# Patient Record
Sex: Female | Born: 1935 | Race: White | Hispanic: No | Smoking: Former smoker
Health system: Southern US, Community
[De-identification: ages and names within clinical notes are randomized; demographics above are authoritative.]

## PROBLEM LIST (undated history)

## (undated) DIAGNOSIS — K219 Gastro-esophageal reflux disease without esophagitis: Secondary | ICD-10-CM

## (undated) DIAGNOSIS — T8859XA Other complications of anesthesia, initial encounter: Secondary | ICD-10-CM

## (undated) DIAGNOSIS — T4145XA Adverse effect of unspecified anesthetic, initial encounter: Secondary | ICD-10-CM

## (undated) DIAGNOSIS — F419 Anxiety disorder, unspecified: Secondary | ICD-10-CM

## (undated) DIAGNOSIS — R112 Nausea with vomiting, unspecified: Secondary | ICD-10-CM

## (undated) DIAGNOSIS — M1990 Unspecified osteoarthritis, unspecified site: Secondary | ICD-10-CM

## (undated) DIAGNOSIS — J302 Other seasonal allergic rhinitis: Secondary | ICD-10-CM

## (undated) DIAGNOSIS — Z9889 Other specified postprocedural states: Secondary | ICD-10-CM

## (undated) DIAGNOSIS — IMO0001 Reserved for inherently not codable concepts without codable children: Secondary | ICD-10-CM

## (undated) HISTORY — PX: COLONOSCOPY: SHX174

## (undated) HISTORY — PX: TUBAL LIGATION: SHX77

## (undated) HISTORY — PX: UPPER GI ENDOSCOPY: SHX6162

---

## 2007-06-30 ENCOUNTER — Ambulatory Visit: Payer: Self-pay | Admitting: Family Medicine

## 2008-07-01 ENCOUNTER — Ambulatory Visit: Payer: Self-pay | Admitting: Family Medicine

## 2009-02-14 ENCOUNTER — Ambulatory Visit: Payer: Self-pay | Admitting: Family Medicine

## 2009-07-11 ENCOUNTER — Ambulatory Visit: Payer: Self-pay | Admitting: Family Medicine

## 2011-11-05 ENCOUNTER — Ambulatory Visit: Payer: Self-pay

## 2014-12-01 ENCOUNTER — Ambulatory Visit: Payer: Self-pay | Admitting: Internal Medicine

## 2015-03-29 NOTE — Discharge Instructions (Signed)
Cataract Surgery °Care After °Refer to this sheet in the next few weeks. These instructions provide you with information on caring for yourself after your procedure. Your caregiver may also give you more specific instructions. Your treatment has been planned according to current medical practices, but problems sometimes occur. Call your caregiver if you have any problems or questions after your procedure.  °HOME CARE INSTRUCTIONS  °· Avoid strenuous activities as directed by your caregiver. °· Ask your caregiver when you can resume driving. °· Use eyedrops or other medicines to help healing and control pressure inside your eye as directed by your caregiver. °· Only take over-the-counter or prescription medicines for pain, discomfort, or fever as directed by your caregiver. °· Do not to touch or rub your eyes. °· You may be instructed to use a protective shield during the first few days and nights after surgery. If not, wear sunglasses to protect your eyes. This is to protect the eye from pressure or from being accidentally bumped. °· Keep the area around your eye clean and dry. Avoid swimming or allowing water to hit you directly in the face while showering. Keep soap and shampoo out of your eyes. °· Do not bend or lift heavy objects. Bending increases pressure in the eye. You can walk, climb stairs, and do light household chores. °· Do not put a contact lens into the eye that had surgery until your caregiver says it is okay to do so. °· Ask your doctor when you can return to work. This will depend on the kind of work that you do. If you work in a dusty environment, you may be advised to wear protective eyewear for a period of time. °· Ask your caregiver when it will be safe to engage in sexual activity. °· Continue with your regular eye exams as directed by your caregiver. °What to expect: °· It is normal to feel itching and mild discomfort for a few days after cataract surgery. Some fluid discharge is also common,  and your eye may be sensitive to light and touch. °· After 1 to 2 days, even moderate discomfort should disappear. In most cases, healing will take about 6 weeks. °· If you received an intraocular lens (IOL), you may notice that colors are very bright or have a blue tinge. Also, if you have been in bright sunlight, everything may appear reddish for a few hours. If you see these color tinges, it is because your lens is clear and no longer cloudy. Within a few months after receiving an IOL, these extra colors should go away. When you have healed, you will probably need new glasses. °SEEK MEDICAL CARE IF:  °· You have increased bruising around your eye. °· You have discomfort not helped by medicine. °SEEK IMMEDIATE MEDICAL CARE IF:  °· You have a  fever. °· You have a worsening or sudden vision loss. °· You have redness, swelling, or increasing pain in the eye. °· You have a thick discharge from the eye that had surgery. °MAKE SURE YOU: °· Understand these instructions. °· Will watch your condition. °· Will get help right away if you are not doing well or get worse. °Document Released: 06/01/2005 Document Revised: 02/04/2012 Document Reviewed: 07/06/2011 °ExitCare® Patient Information ©2015 [employer]. This information is not intended to replace advice given to you by your health care provider. Make sure you discuss any questions you have with your health care provider. °General Anesthesia, Care After °Refer to this sheet in the next few weeks.   These instructions provide you with information on caring for yourself after your procedure. Your health care provider may also give you more specific instructions. Your treatment has been planned according to current medical practices, but problems sometimes occur. Call your health care provider if you have any problems or questions after your procedure. WHAT TO EXPECT AFTER THE PROCEDURE After the procedure, it is typical to experience:  Sleepiness.  Nausea and  vomiting. HOME CARE INSTRUCTIONS  For the first 24 hours after general anesthesia:  Have a responsible person with you.  Do not drive a car. If you are alone, do not take public transportation.  Do not drink alcohol.  Do not take medicine that has not been prescribed by your health care provider.  Do not sign important papers or make important decisions.  You may resume a normal diet and activities as directed by your health care provider.  Change bandages (dressings) as directed.  If you have questions or problems that seem related to general anesthesia, call the hospital and ask for the anesthetist or anesthesiologist on call. SEEK MEDICAL CARE IF:  You have nausea and vomiting that continue the day after anesthesia.  You develop a rash. SEEK IMMEDIATE MEDICAL CARE IF:   You have difficulty breathing.  You have chest pain.  You have any allergic problems.   FOLLOW UP APPOINTMENT IS: Tomorrow, May 5 @ 11:05 am  Document Released: 02/18/2001 Document Revised: 11/17/2013 Document Reviewed: 05/28/2013 [facility] Patient Information 2015 [place]ExitCare, [place]. This information is not intended to replace advice given to you by your health care provider. Make sure you discuss any questions you have with your health care provider.

## 2015-03-30 ENCOUNTER — Ambulatory Visit
Admission: RE | Admit: 2015-03-30 | Discharge: 2015-03-30 | Disposition: A | Discharge status: Home / Self Care | Payer: Medicare Other | Source: Ambulatory Visit | Attending: Ophthalmology | Admitting: Ophthalmology

## 2015-03-30 ENCOUNTER — Encounter
Admission: RE | Disposition: A | Discharge status: Home / Self Care | Payer: Self-pay | Source: Ambulatory Visit | Attending: Ophthalmology

## 2015-03-30 ENCOUNTER — Ambulatory Visit: Payer: Medicare Other | Admitting: Anesthesiology

## 2015-03-30 DIAGNOSIS — Z888 Allergy status to other drugs, medicaments and biological substances status: Secondary | ICD-10-CM | POA: Diagnosis not present

## 2015-03-30 DIAGNOSIS — H2512 Age-related nuclear cataract, left eye: Secondary | ICD-10-CM | POA: Diagnosis not present

## 2015-03-30 DIAGNOSIS — M1990 Unspecified osteoarthritis, unspecified site: Secondary | ICD-10-CM | POA: Insufficient documentation

## 2015-03-30 DIAGNOSIS — Z87891 Personal history of nicotine dependence: Secondary | ICD-10-CM | POA: Insufficient documentation

## 2015-03-30 DIAGNOSIS — K219 Gastro-esophageal reflux disease without esophagitis: Secondary | ICD-10-CM | POA: Insufficient documentation

## 2015-03-30 HISTORY — DX: Gastro-esophageal reflux disease without esophagitis: K21.9

## 2015-03-30 HISTORY — DX: Reserved for inherently not codable concepts without codable children: IMO0001

## 2015-03-30 HISTORY — DX: Other complications of anesthesia, initial encounter: T88.59XA

## 2015-03-30 HISTORY — DX: Other seasonal allergic rhinitis: J30.2

## 2015-03-30 HISTORY — DX: Anxiety disorder, unspecified: F41.9

## 2015-03-30 HISTORY — DX: Other specified postprocedural states: Z98.890

## 2015-03-30 HISTORY — PX: CATARACT EXTRACTION W/PHACO: SHX586

## 2015-03-30 HISTORY — DX: Nausea with vomiting, unspecified: R11.2

## 2015-03-30 HISTORY — DX: Adverse effect of unspecified anesthetic, initial encounter: T41.45XA

## 2015-03-30 HISTORY — DX: Unspecified osteoarthritis, unspecified site: M19.90

## 2015-03-30 SURGERY — PHACOEMULSIFICATION, CATARACT, WITH IOL INSERTION
Anesthesia: Monitor Anesthesia Care | Laterality: Left

## 2015-03-30 MED ORDER — FENTANYL CITRATE (PF) 100 MCG/2ML IJ SOLN
INTRAMUSCULAR | Status: DC | PRN
  Administered 2015-03-30: 50 ug via INTRAVENOUS

## 2015-03-30 MED ORDER — POVIDONE-IODINE 5 % OP SOLN
1.0000 "application " | Freq: Once | OPHTHALMIC | Status: AC
  Administered 2015-03-30: 1 via OPHTHALMIC

## 2015-03-30 MED ORDER — BSS IO SOLN
INTRAOCULAR | Status: DC | PRN
  Administered 2015-03-30: 3 mL via INTRAOCULAR
  Administered 2015-03-30: 1 via INTRAOCULAR

## 2015-03-30 MED ORDER — MIDAZOLAM HCL 2 MG/2ML IJ SOLN
INTRAMUSCULAR | Status: DC | PRN
  Administered 2015-03-30: 1 mg via INTRAVENOUS

## 2015-03-30 MED ORDER — TETRACAINE HCL 0.5 % OP SOLN
1.0000 [drp] | Freq: Once | OPHTHALMIC | Status: AC
  Administered 2015-03-30: 1 [drp] via OPHTHALMIC

## 2015-03-30 MED ORDER — EPINEPHRINE HCL 1 MG/ML IJ SOLN
INTRAMUSCULAR | Status: DC | PRN
  Administered 2015-03-30: 1 mg

## 2015-03-30 MED ORDER — BSS IO SOLN
INTRAOCULAR | Status: DC | PRN
  Administered 2015-03-30: 65 mL via INTRAOCULAR

## 2015-03-30 MED ORDER — ACETAMINOPHEN 160 MG/5ML PO SOLN
325.0000 mg | ORAL | Status: DC | PRN
Start: 2015-03-30 — End: 2015-03-30

## 2015-03-30 MED ORDER — LIDOCAINE HCL (PF) 4 % IJ SOLN
INTRAMUSCULAR | Status: DC | PRN
  Administered 2015-03-30: 3 mL

## 2015-03-30 MED ORDER — BRIMONIDINE TARTRATE 0.2 % OP SOLN
OPHTHALMIC | Status: DC | PRN
  Administered 2015-03-30: 1 [drp] via OPHTHALMIC

## 2015-03-30 MED ORDER — ACETAMINOPHEN 325 MG PO TABS
325.0000 mg | ORAL_TABLET | ORAL | Status: DC | PRN
Start: 2015-03-30 — End: 2015-03-30

## 2015-03-30 MED ORDER — ONDANSETRON HCL 4 MG/2ML IJ SOLN: 4.0000 mg | Freq: Once | INTRAMUSCULAR | Status: DC | PRN

## 2015-03-30 MED ORDER — CEFUROXIME OPHTHALMIC INJECTION 1 MG/0.1 ML
INJECTION | OPHTHALMIC | Status: DC | PRN
  Administered 2015-03-30: 3 mg via OPHTHALMIC

## 2015-03-30 MED ORDER — TIMOLOL MALEATE 0.5 % OP SOLN
OPHTHALMIC | Status: DC | PRN
  Administered 2015-03-30: 1 [drp] via OPHTHALMIC

## 2015-03-30 MED ORDER — NA HYALUR & NA CHOND-NA HYALUR 0.4-0.35 ML IO KIT
PACK | INTRAOCULAR | Status: DC | PRN
  Administered 2015-03-30: 1 mL via INTRAOCULAR

## 2015-03-30 MED ORDER — ARMC OPHTHALMIC DILATING GEL
1.0000 "application " | Freq: Once | OPHTHALMIC | Status: AC
  Administered 2015-03-30 (×2): 1 via OPHTHALMIC

## 2015-03-30 SURGICAL SUPPLY — 24 items
CANNULA ANT/CHMB 27GA (MISCELLANEOUS) ×3 IMPLANT
GLOVE SURG LX 7.5 STRW (GLOVE) ×2
GLOVE SURG LX STRL 7.5 STRW (GLOVE) ×1 IMPLANT
GLOVE SURG TRIUMPH 8.0 PF LTX (GLOVE) ×3 IMPLANT
GOWN STRL REUS W/ TWL LRG LVL3 (GOWN DISPOSABLE) ×2 IMPLANT
GOWN STRL REUS W/TWL LRG LVL3 (GOWN DISPOSABLE) ×4
LENS IOL TECNIS 21.5 (Intraocular Lens) ×3 IMPLANT
LENS IOL TECNIS MONO 1P 21.5 (Intraocular Lens) ×1 IMPLANT
MARKER SKIN SURG W/RULER VIO (MISCELLANEOUS) ×3 IMPLANT
NDL RETROBULBAR .5 NSTRL (NEEDLE) IMPLANT
NEEDLE FILTER BLUNT 18X 1/2SAF (NEEDLE) ×2
NEEDLE FILTER BLUNT 18X1 1/2 (NEEDLE) ×1 IMPLANT
PACK CATARACT BRASINGTON (MISCELLANEOUS) ×3 IMPLANT
PACK EYE AFTER SURG (MISCELLANEOUS) ×3 IMPLANT
PACK OPTHALMIC (MISCELLANEOUS) ×3 IMPLANT
RING MALYGIN 7.0 (MISCELLANEOUS) IMPLANT
SUT ETHILON 10-0 CS-B-6CS-B-6 (SUTURE)
SUT VICRYL  9 0 (SUTURE)
SUT VICRYL 9 0 (SUTURE) IMPLANT
SUTURE EHLN 10-0 CS-B-6CS-B-6 (SUTURE) IMPLANT
SYR 3ML LL SCALE MARK (SYRINGE) ×3 IMPLANT
SYR TB 1ML LUER SLIP (SYRINGE) ×3 IMPLANT
WATER STERILE IRR 500ML POUR (IV SOLUTION) ×3 IMPLANT
WIPE NON LINTING 3.25X3.25 (MISCELLANEOUS) ×3 IMPLANT

## 2015-03-30 NOTE — Anesthesia Postprocedure Evaluation (Signed)
  Anesthesia Post-op Note  Patient: Betty Munoz  Procedure(s) Performed: Procedure(s): CATARACT EXTRACTION PHACO AND INTRAOCULAR LENS PLACEMENT (IOC) (Left)  Anesthesia type:MAC  Patient location: PACU  Post pain: Pain level controlled  Post assessment: Post-op Vital signs reviewed, Patient's Cardiovascular Status Stable, Respiratory Function Stable, Patent Airway and No signs of Nausea or vomiting  Post vital signs: Reviewed and stable  Last Vitals:  Filed Vitals:   03/30/15 0739  BP:   Pulse: 77  Temp:   Resp:     Level of consciousness: awake, alert  and patient cooperative  Complications: No apparent anesthesia complications

## 2015-03-30 NOTE — Progress Notes (Signed)
Patient initially c/o dizziness upon arrival from OR.  Patient now reports "I feel fine."  Patient able to stand and transfer to wheelchair without difficulty.

## 2015-03-30 NOTE — H&P (Signed)
  The History and Physical notes were faxed and scanned in.  There were no changes in the patient's condition.  

## 2015-03-30 NOTE — Transfer of Care (Signed)
Immediate Anesthesia Transfer of Care Note  Patient: Betty Munoz  Procedure(s) Performed: Procedure(s): CATARACT EXTRACTION PHACO AND INTRAOCULAR LENS PLACEMENT (IOC) (Left)  Patient Location: PACU  Anesthesia Type: MAC  Level of Consciousness: awake, alert  and patient cooperative  Airway and Oxygen Therapy: Patient Spontanous Breathing and Patient connected to supplemental oxygen  Post-op Assessment: Post-op Vital signs reviewed, Patient's Cardiovascular Status Stable, Respiratory Function Stable, Patent Airway and No signs of Nausea or vomiting  Post-op Vital Signs: Reviewed and stable  Complications: No apparent anesthesia complications

## 2015-03-30 NOTE — Anesthesia Procedure Notes (Signed)
Procedure Name: MAC Performed by: Kya Mayfield Pre-anesthesia Checklist: Patient identified, Emergency Drugs available, Suction available, Patient being monitored and Timeout performed Patient Re-evaluated:Patient Re-evaluated prior to inductionOxygen Delivery Method: Nasal cannula       

## 2015-03-30 NOTE — Anesthesia Preprocedure Evaluation (Signed)
Anesthesia Evaluation  Patient identified by MRN, date of birth, ID band Patient awake    Reviewed: Allergy & Precautions, H&P , NPO status , Patient's Chart, lab work & pertinent test results  History of Anesthesia Complications (+) PONV and history of anesthetic complications  Airway Mallampati: II  TM Distance: >3 FB Neck ROM: full    Dental   Pulmonary former smoker,    Pulmonary exam normal       Cardiovascular Normal cardiovascular exam    Neuro/Psych PSYCHIATRIC DISORDERS (anxiety)    GI/Hepatic GERD-  ,  Endo/Other    Renal/GU      Musculoskeletal   Abdominal   Peds  Hematology   Anesthesia Other Findings   Reproductive/Obstetrics                             Anesthesia Physical Anesthesia Plan  ASA: II  Anesthesia Plan: MAC   Post-op Pain Management:    Induction:   Airway Management Planned:   Additional Equipment:   Intra-op Plan:   Post-operative Plan:   Informed Consent: I have reviewed the patients History and Physical, chart, labs and discussed the procedure including the risks, benefits and alternatives for the proposed anesthesia with the patient or authorized representative who has indicated his/her understanding and acceptance.     Plan Discussed with: CRNA  Anesthesia Plan Comments:         Anesthesia Quick Evaluation

## 2015-03-30 NOTE — Op Note (Signed)
OPERATIVE NOTE  Betty Munoz [id number] 03/30/2015   PREOPERATIVE DIAGNOSIS:     Nuclear sclerotic cataract left eye. H25.12   POSTOPERATIVE DIAGNOSIS:    Nuclear sclerotic cataract left eye.     PROCEDURE:  Phacoemusification with posterior chamber intraocular lens placement of the left eye   LENS:   Implant Name Type Inv. Item Serial No. Manufacturer Lot No. LRB No. Used  LENS IMPL INTRAOC ZCB00 21.5 - [id number] Intraocular Lens LENS IMPL INTRAOC ZCB00 21.5 [id number] AMO   Left 1       ULTRASOUND TIME: 15 of 1 minutes 25 seconds, CDE 13.0  SURGEON:  Deirdre Evenerhadwick R. Nasira Janusz, MD   ANESTHESIA:  Topical with tetracaine drops and 2% Xylocaine jelly, augmented with 1% preservative-free intracameral lidocaine.    COMPLICATIONS:  None.   DESCRIPTION OF PROCEDURE:  The patient was identified in the holding room and transported to the operating room and placed in the supine position under the operating microscope.  The left eye was identified as the operative eye and it was prepped and draped in the usual sterile ophthalmic fashion.   A 1 millimeter clear-corneal paracentesis was made at the 1:30 position. 0.5 ml of preservative-free 1% lidocaine was injected into the anterior chamber.  The anterior chamber was filled with Viscoat viscoelastic.  A 2.4 millimeter keratome was used to make a near-clear corneal incision at the 10:30 position.  .  A curvilinear capsulorrhexis was made with a cystotome and capsulorrhexis forceps.  Balanced salt solution was used to hydrodissect and hydrodelineate the nucleus.   Phacoemulsification was then used in stop and chop fashion to remove the lens nucleus and epinucleus.  The remaining cortex was then removed using the irrigation and aspiration handpiece. Provisc was then placed into the capsular bag to distend it for lens placement.  A 21.5 -diopter lens was then injected into the capsular bag.  The remaining viscoelastic was aspirated.   Wounds  were hydrated with balanced salt solution.  The anterior chamber was inflated to a physiologic pressure with balanced salt solution.  No wound leaks were noted. Cefuroxime 0.1 ml of a 10mg /ml solution was injected into the anterior chamber for a dose of 1 mg of intracameral antibiotic at the completion of the case.   Timolol and Brimonidine drops were applied to the eye.  The patient was taken to the recovery room in stable condition without complications of anesthesia or surgery.  [name] 03/30/2015, 8:48 AM

## 2015-04-06 ENCOUNTER — Encounter: Payer: Self-pay | Admitting: Ophthalmology

## 2015-04-14 ENCOUNTER — Encounter: Payer: Self-pay | Admitting: Ophthalmology

## 2015-05-23 ENCOUNTER — Encounter: Payer: Self-pay | Admitting: *Deleted

## 2015-05-30 ENCOUNTER — Ambulatory Visit
Admission: EM | Admit: 2015-05-30 | Discharge: 2015-05-30 | Disposition: A | Discharge status: Home / Self Care | Payer: Medicare Other | Attending: Internal Medicine | Admitting: Internal Medicine

## 2015-05-30 ENCOUNTER — Encounter: Payer: Self-pay | Admitting: *Deleted

## 2015-05-30 DIAGNOSIS — J209 Acute bronchitis, unspecified: Secondary | ICD-10-CM | POA: Diagnosis not present

## 2015-05-30 MED ORDER — ALBUTEROL SULFATE HFA 108 (90 BASE) MCG/ACT IN AERS: 1.0000 | INHALATION_SPRAY | RESPIRATORY_TRACT | Status: DC | PRN

## 2015-05-30 MED ORDER — AZITHROMYCIN 250 MG PO TABS: 250.0000 mg | ORAL_TABLET | Freq: Every day | ORAL | Status: DC

## 2015-05-30 MED ORDER — IPRATROPIUM-ALBUTEROL 0.5-2.5 (3) MG/3ML IN SOLN: 3.0000 mL | Freq: Once | RESPIRATORY_TRACT | Status: DC

## 2015-05-30 MED ORDER — IPRATROPIUM-ALBUTEROL 0.5-2.5 (3) MG/3ML IN SOLN
3.0000 mL | Freq: Four times a day (QID) | RESPIRATORY_TRACT | Status: DC
  Administered 2015-05-30: 3 mL via RESPIRATORY_TRACT

## 2015-05-30 MED ORDER — AZITHROMYCIN 200 MG/5ML PO SUSR: 500.0000 mg | Freq: Every day | ORAL | Status: DC

## 2015-05-30 NOTE — Discharge Instructions (Signed)
Prescriptions for albuterol inhaler and zithromax were sent to [facility] in [place]. Prednisone may also be helpful in resolving wheezing, breathlessness quickly.  Chronic Obstructive Pulmonary Disease Chronic obstructive pulmonary disease (COPD) is a common lung problem. In COPD, the flow of air from the lungs is limited. The way your lungs work will probably never return to normal, but there are things you can do to improve your lungs and make yourself feel better. HOME CARE  Take all medicines as told by your doctor.  Avoid medicines or cough syrups that dry up your airway (such as antihistamines) and do not allow you to get rid of thick spit. You do not need to avoid them if told differently by your doctor.  If you smoke, stop. Smoking makes the problem worse.  Avoid being around things that make your breathing worse (like smoke, chemicals, and fumes).  Use oxygen therapy and therapy to help improve your lungs (pulmonary rehabilitation) if told by your doctor. If you need home oxygen therapy, ask your doctor if you should buy a tool to measure your oxygen level (oximeter).  Avoid people who have a sickness you can catch (contagious).  Avoid going outside when it is very hot, cold, or humid.  Eat healthy foods. Eat smaller meals more often. Rest before meals.  Stay active, but remember to also rest.  Make sure to get all the shots (vaccines) your doctor recommends. Ask your doctor if you need a pneumonia shot.  Learn and use tips on how to relax.  Learn and use tips on how to control your breathing as told by your doctor. Try:  Breathing in (inhaling) through your nose for 1 second. Then, pucker your lips and breath out (exhale) through your lips for 2 seconds.  Putting one hand on your belly (abdomen). Breathe in slowly through your nose for 1 second. Your hand on your belly should move out. Pucker your lips and breathe out slowly through your lips. Your hand on your belly should  move in as you breathe out.  Learn and use controlled coughing to clear thick spit from your lungs. The steps are: 1. Lean your head a little forward. 2. Breathe in deeply. 3. Try to hold your breath for 3 seconds. 4. Keep your mouth slightly open while coughing 2 times. 5. Spit any thick spit out into a tissue. 6. Rest and do the steps again 1 or 2 times as needed. GET HELP IF:  You cough up more thick spit than usual.  There is a change in the color or thickness of the spit.  It is harder to breathe than usual.  Your breathing is faster than usual. GET HELP RIGHT AWAY IF:   You have shortness of breath while resting.  You have shortness of breath that stops you from:  Being able to talk.  Doing normal activities.  You chest hurts for longer than 5 minutes.  Your skin color is more blue than usual.  Your pulse oximeter shows that you have low oxygen for longer than 5 minutes. MAKE SURE YOU:   Understand these instructions.  Will watch your condition.  Will get help right away if you are not doing well or get worse. Document Released: 04/30/2008 Document Revised: 03/29/2014 Document Reviewed: 07/09/2013 [facility] Patient Information 2015 [place]ExitCare, MarylandLLC. This information is not intended to replace advice given to you by your health care provider. Make sure you discuss any questions you have with your health care provider.

## 2015-05-30 NOTE — ED Notes (Signed)
Pt states "ive been wheezing, short of breath, coughing starting about 2 days ago"

## 2015-05-30 NOTE — ED Provider Notes (Addendum)
CSN: [id number]     Arrival date & time 05/30/15  1041 History   First MD Initiated Contact with Patient 05/30/15 1143     Chief Complaint  Patient presents with  . Cough  . Wheezing  . Fatigue  . Shortness of Breath   HPI Patient is an 81 year old lady with past medical history notable for bronchitis, former smoker. He presents with a couple days history of dyspnea, wheezing, cough. No fever. Pleuritic chest discomfort.  Past Medical History  Diagnosis Date  . Complication of anesthesia   . PONV (postoperative nausea and vomiting)     once  . Reflux   . Anxiety   . Arthritis     patient states all over, neck arms,hands,etc.  . Seasonal allergies    Past Surgical History  Procedure Laterality Date  . Colonoscopy    . Upper gi endoscopy    . Tubal ligation    . Cataract extraction w/phaco Left 03/30/2015    Procedure: CATARACT EXTRACTION PHACO AND INTRAOCULAR LENS PLACEMENT (IOC);  Surgeon: Lockie Molahadwick Brasington, MD;  [facility];  Service: Ophthalmology;  Laterality: Left;   No family history on file. History  Substance Use Topics  . Smoking status: Former Smoker -- 1.00 packs/day    Types: Cigarettes  . Smokeless tobacco: Not on file  . Alcohol Use: No   OB History    No data available     Review of Systems  All other systems reviewed and are negative.   Allergies  Adhesive; Bacitracin; and Neosporin  Home Medications   Prior to Admission medications   Medication Sig Start Date End Date Taking? Authorizing Provider  omeprazole (PRILOSEC) 40 MG capsule Take 40 mg by mouth daily. am   Yes Historical Provider, MD   BP 179/77 mmHg  Pulse 84  Ht 5' (1.524 m)  Wt 157 lb (71.215 kg)  BMI 30.66 kg/m2  SpO2 97% Physical Exam  Constitutional: She is oriented to person, place, and time. No distress.  Alert, nicely groomed  HENT:  Head: Atraumatic.  Eyes:  Conjugate gaze, no eye redness/drainage  Neck: Neck supple.  Cardiovascular: Normal rate  and regular rhythm.   Pulmonary/Chest: No respiratory distress. She has wheezes. She has no rales.  Soft wheezing throughout, symmetric breath sounds, somewhat diminished posteriorly.  Abdominal: She exhibits no distension.  Musculoskeletal: Normal range of motion.  No leg swelling  Neurological: She is alert and oriented to person, place, and time.  Skin: Skin is warm and dry.  No cyanosis  Nursing note and vitals reviewed.   ED Course  Procedures   ED ECG REPORT   Date: 05/30/2015  EKG Time: 10:35:27am  Rate: 82  Rhythm: normal sinus rhythm, with PACs  Axis: -15  Intervals:none  ST&T Change: none  Narrative Interpretation: sinus rhythm with premature atrial complexes, otherwise normal ECG I viewed this ECG and agree with above.  DuoNeb breathing treatment given at the urgent care for wheezing, with improvement in wheezing and dyspnea.  MDM   1. Bronchitis, acute, with bronchospasm   2.     Probable COPD Prescriptions for Zithromax and albuterol sent to the pharmacy. Discussed a prednisone burst with the patient, who would like to hold off for now. Reche[facility] symptoms    Eustace MooreLaura W Teshawn Moan, MD 05/30/15 1249  Eustace MooreLaura W Zellie Jenning, MD 05/30/15 [phone]

## 2015-05-31 ENCOUNTER — Encounter: Payer: Self-pay | Admitting: Anesthesiology

## 2015-05-31 NOTE — Discharge Instructions (Signed)

## 2015-06-01 ENCOUNTER — Ambulatory Visit: Admission: RE | Admit: 2015-06-01 | Payer: Medicare Other | Source: Ambulatory Visit | Admitting: Ophthalmology

## 2015-06-01 SURGERY — PHACOEMULSIFICATION, CATARACT, WITH IOL INSERTION
Anesthesia: Topical | Laterality: Right

## 2015-06-15 ENCOUNTER — Encounter: Payer: Self-pay | Admitting: *Deleted

## 2015-06-17 NOTE — Discharge Instructions (Signed)

## 2015-06-22 ENCOUNTER — Encounter: Payer: Self-pay | Admitting: Anesthesiology

## 2015-06-22 ENCOUNTER — Ambulatory Visit: Payer: Medicare Other | Admitting: Anesthesiology

## 2015-06-22 ENCOUNTER — Encounter
Admission: RE | Disposition: A | Discharge status: Home / Self Care | Payer: Self-pay | Source: Ambulatory Visit | Attending: Ophthalmology

## 2015-06-22 ENCOUNTER — Ambulatory Visit
Admission: RE | Admit: 2015-06-22 | Discharge: 2015-06-22 | Disposition: A | Discharge status: Home / Self Care | Payer: Medicare Other | Source: Ambulatory Visit | Attending: Ophthalmology | Admitting: Ophthalmology

## 2015-06-22 DIAGNOSIS — H2511 Age-related nuclear cataract, right eye: Secondary | ICD-10-CM | POA: Insufficient documentation

## 2015-06-22 DIAGNOSIS — M1990 Unspecified osteoarthritis, unspecified site: Secondary | ICD-10-CM | POA: Insufficient documentation

## 2015-06-22 DIAGNOSIS — Z87891 Personal history of nicotine dependence: Secondary | ICD-10-CM | POA: Insufficient documentation

## 2015-06-22 DIAGNOSIS — Z881 Allergy status to other antibiotic agents status: Secondary | ICD-10-CM | POA: Diagnosis not present

## 2015-06-22 DIAGNOSIS — Z91048 Other nonmedicinal substance allergy status: Secondary | ICD-10-CM | POA: Insufficient documentation

## 2015-06-22 DIAGNOSIS — Z79899 Other long term (current) drug therapy: Secondary | ICD-10-CM | POA: Diagnosis not present

## 2015-06-22 DIAGNOSIS — Z9841 Cataract extraction status, right eye: Secondary | ICD-10-CM | POA: Insufficient documentation

## 2015-06-22 DIAGNOSIS — K219 Gastro-esophageal reflux disease without esophagitis: Secondary | ICD-10-CM | POA: Insufficient documentation

## 2015-06-22 HISTORY — PX: CATARACT EXTRACTION W/PHACO: SHX586

## 2015-06-22 SURGERY — PHACOEMULSIFICATION, CATARACT, WITH IOL INSERTION
Anesthesia: Monitor Anesthesia Care | Laterality: Right | Wound class: Clean

## 2015-06-22 MED ORDER — POVIDONE-IODINE 5 % OP SOLN
1.0000 "application " | OPHTHALMIC | Status: DC | PRN
  Administered 2015-06-22: 1 via OPHTHALMIC

## 2015-06-22 MED ORDER — MIDAZOLAM HCL 2 MG/2ML IJ SOLN
INTRAMUSCULAR | Status: DC | PRN
  Administered 2015-06-22: 2 mg via INTRAVENOUS

## 2015-06-22 MED ORDER — TIMOLOL MALEATE 0.5 % OP SOLN
OPHTHALMIC | Status: DC | PRN
Start: 2015-06-22 — End: 2015-06-22
  Administered 2015-06-22: 1 [drp] via OPHTHALMIC

## 2015-06-22 MED ORDER — FENTANYL CITRATE (PF) 100 MCG/2ML IJ SOLN
INTRAMUSCULAR | Status: DC | PRN
  Administered 2015-06-22: 50 ug via INTRAVENOUS

## 2015-06-22 MED ORDER — CEFUROXIME OPHTHALMIC INJECTION 1 MG/0.1 ML
INJECTION | OPHTHALMIC | Status: DC | PRN
Start: 2015-06-22 — End: 2015-06-22
  Administered 2015-06-22: 0.1 mL via INTRACAMERAL

## 2015-06-22 MED ORDER — ARMC OPHTHALMIC DILATING GEL
1.0000 "application " | OPHTHALMIC | Status: DC | PRN
  Administered 2015-06-22 (×2): 1 via OPHTHALMIC

## 2015-06-22 MED ORDER — BRIMONIDINE TARTRATE 0.2 % OP SOLN
OPHTHALMIC | Status: DC | PRN
Start: 2015-06-22 — End: 2015-06-22
  Administered 2015-06-22: 1 [drp] via OPHTHALMIC

## 2015-06-22 MED ORDER — NA HYALUR & NA CHOND-NA HYALUR 0.4-0.35 ML IO KIT
PACK | INTRAOCULAR | Status: DC | PRN
Start: 2015-06-22 — End: 2015-06-22
  Administered 2015-06-22: 1 mL via INTRAOCULAR

## 2015-06-22 MED ORDER — TETRACAINE HCL 0.5 % OP SOLN
1.0000 [drp] | OPHTHALMIC | Status: DC | PRN
  Administered 2015-06-22: 1 [drp] via OPHTHALMIC

## 2015-06-22 MED ORDER — LIDOCAINE HCL (PF) 4 % IJ SOLN
INTRAOCULAR | Status: DC | PRN
  Administered 2015-06-22: 1 mL via OPHTHALMIC

## 2015-06-22 MED ORDER — EPINEPHRINE HCL 1 MG/ML IJ SOLN
INTRAOCULAR | Status: DC | PRN
  Administered 2015-06-22: 60 mL via OPHTHALMIC

## 2015-06-22 MED ORDER — LACTATED RINGERS IV SOLN: INTRAVENOUS | Status: DC

## 2015-06-22 SURGICAL SUPPLY — 26 items
CANNULA ANT/CHMB 27GA (MISCELLANEOUS) ×3 IMPLANT
GLOVE SURG LX 7.5 STRW (GLOVE) ×2
GLOVE SURG LX STRL 7.5 STRW (GLOVE) ×1 IMPLANT
GLOVE SURG TRIUMPH 8.0 PF LTX (GLOVE) ×3 IMPLANT
GOWN STRL REUS W/ TWL LRG LVL3 (GOWN DISPOSABLE) ×2 IMPLANT
GOWN STRL REUS W/TWL LRG LVL3 (GOWN DISPOSABLE) ×4
LENS IOL TECNIS 21.5 (Intraocular Lens) ×3 IMPLANT
LENS IOL TECNIS MONO 1P 21.5 (Intraocular Lens) ×1 IMPLANT
MARKER SKIN SURG W/RULER VIO (MISCELLANEOUS) ×3 IMPLANT
NDL RETROBULBAR .5 NSTRL (NEEDLE) IMPLANT
NEEDLE FILTER BLUNT 18X 1/2SAF (NEEDLE) ×2
NEEDLE FILTER BLUNT 18X1 1/2 (NEEDLE) ×1 IMPLANT
PACK CATARACT BRASINGTON (MISCELLANEOUS) ×3 IMPLANT
PACK EYE AFTER SURG (MISCELLANEOUS) ×3 IMPLANT
PACK OPTHALMIC (MISCELLANEOUS) ×3 IMPLANT
RING MALYGIN 7.0 (MISCELLANEOUS) IMPLANT
SUT ETHILON 10-0 CS-B-6CS-B-6 (SUTURE)
SUT VICRYL  9 0 (SUTURE)
SUT VICRYL 9 0 (SUTURE) IMPLANT
SUTURE EHLN 10-0 CS-B-6CS-B-6 (SUTURE) IMPLANT
SYR 3ML LL SCALE MARK (SYRINGE) ×3 IMPLANT
SYR 5ML LL (SYRINGE) IMPLANT
SYR TB 1ML LUER SLIP (SYRINGE) ×3 IMPLANT
WATER STERILE IRR 250ML POUR (IV SOLUTION) ×3 IMPLANT
WATER STERILE IRR 500ML POUR (IV SOLUTION) IMPLANT
WIPE NON LINTING 3.25X3.25 (MISCELLANEOUS) ×3 IMPLANT

## 2015-06-22 NOTE — Anesthesia Preprocedure Evaluation (Signed)
Anesthesia Evaluation  Patient identified by MRN, date of birth, ID band Patient awake    Reviewed: Allergy & Precautions, H&P , NPO status , Patient's Chart, lab work & pertinent test results, reviewed documented beta blocker date and time   History of Anesthesia Complications (+) PONV and history of anesthetic complications  Airway Mallampati: II  TM Distance: >3 FB Neck ROM: full    Dental no notable dental hx.    Pulmonary neg pulmonary ROS, former smoker,  breath sounds clear to auscultation  Pulmonary exam normal       Cardiovascular Exercise Tolerance: Good negative cardio ROS  Rhythm:regular Rate:Normal     Neuro/Psych PSYCHIATRIC DISORDERS (anxiety) negative neurological ROS  negative psych ROS   GI/Hepatic Neg liver ROS, GERD-  ,  Endo/Other  negative endocrine ROS  Renal/GU negative Renal ROS  negative genitourinary   Musculoskeletal   Abdominal   Peds  Hematology negative hematology ROS (+)   Anesthesia Other Findings   Reproductive/Obstetrics negative OB ROS                             Anesthesia Physical Anesthesia Plan  ASA: II  Anesthesia Plan: MAC   Post-op Pain Management:    Induction:   Airway Management Planned:   Additional Equipment:   Intra-op Plan:   Post-operative Plan:   Informed Consent: I have reviewed the patients History and Physical, chart, labs and discussed the procedure including the risks, benefits and alternatives for the proposed anesthesia with the patient or authorized representative who has indicated his/her understanding and acceptance.   Dental Advisory Given  Plan Discussed with: CRNA  Anesthesia Plan Comments:         Anesthesia Quick Evaluation

## 2015-06-22 NOTE — Op Note (Signed)
LOCATION:  [facility]   PREOPERATIVE DIAGNOSIS:    Nuclear sclerotic cataract right eye. H25.11   POSTOPERATIVE DIAGNOSIS:  Nuclear sclerotic cataract right eye.     PROCEDURE:  Phacoemusification with posterior chamber intraocular lens placement of the right eye   LENS:   Implant Name Type Inv. Item Serial No. Manufacturer Lot No. LRB No. Used  LENS IMPL INTRAOC ZCB00 21.5 - [id number] Intraocular Lens LENS IMPL INTRAOC ZCB00 21.5 [id number] AMO   Right 1        ULTRASOUND TIME: 15 % of 1 minutes, 31 seconds.  CDE 13.8   SURGEON:  Deirdre Evener, MD   ANESTHESIA:  Topical with tetracaine drops and 2% Xylocaine jelly, augmented with 1% preservative-free intracameral lidocaine. .   COMPLICATIONS:  None.   DESCRIPTION OF PROCEDURE:  The patient was identified in the holding room and transported to the operating room and placed in the supine position under the operating microscope.  The right eye was identified as the operative eye and it was prepped and draped in the usual sterile ophthalmic fashion.   A 1 millimeter clear-corneal paracentesis was made at the 12:00 position.  0.5 ml of preservative-free 1% lidocaine was injected into the anterior chamber. The anterior chamber was filled with Viscoat viscoelastic.  A 2.4 millimeter keratome was used to make a near-clear corneal incision at the 9:00 position.  A curvilinear capsulorrhexis was made with a cystotome and capsulorrhexis forceps.  Balanced salt solution was used to hydrodissect and hydrodelineate the nucleus.   Phacoemulsification was then used in stop and chop fashion to remove the lens nucleus and epinucleus.  The remaining cortex was then removed using the irrigation and aspiration handpiece. Provisc was then placed into the capsular bag to distend it for lens placement.  A lens was then injected into the capsular bag.  The remaining viscoelastic was aspirated.   Wounds were hydrated with balanced salt  solution.  The anterior chamber was inflated to a physiologic pressure with balanced salt solution.  No wound leaks were noted. Cefuroxime 0.1 ml of a /ml solution was injected into the anterior chamber for a dose of 1 mg of intracameral antibiotic at the completion of the case.   Timolol and Brimonidine drops were applied to the eye.  The patient was taken to the recovery room in stable condition without complications of anesthesia or surgery.   Claira Jeter 06/22/2015, 11:28 AM

## 2015-06-22 NOTE — Anesthesia Postprocedure Evaluation (Signed)
  Anesthesia Post-op Note  Patient: Betty Munoz  Procedure(s) Performed: Procedure(s) with comments: CATARACT EXTRACTION PHACO AND INTRAOCULAR LENS PLACEMENT (IOC) (Right) - SHUGARCAINE  Anesthesia type:MAC  Patient location: PACU  Post pain: Pain level controlled  Post assessment: Post-op Vital signs reviewed, Patient's Cardiovascular Status Stable, Respiratory Function Stable, Patent Airway and No signs of Nausea or vomiting  Post vital signs: Reviewed and stable  Last Vitals:  Filed Vitals:   06/22/15 1130  BP:   Pulse: 65  Temp: 36.5 C  Resp:     Level of consciousness: awake, alert  and patient cooperative  Complications: No apparent anesthesia complications

## 2015-06-22 NOTE — Anesthesia Procedure Notes (Signed)
Procedure Name: MAC Performed by: Klohe Lovering Pre-anesthesia Checklist: Patient identified, Emergency Drugs available, Suction available, Timeout performed and Patient being monitored Patient Re-evaluated:Patient Re-evaluated prior to inductionOxygen Delivery Method: Nasal cannula Placement Confirmation: positive ETCO2     

## 2015-06-22 NOTE — Transfer of Care (Signed)
Immediate Anesthesia Transfer of Care Note  Patient: Betty Munoz  Procedure(s) Performed: Procedure(s) with comments: CATARACT EXTRACTION PHACO AND INTRAOCULAR LENS PLACEMENT (IOC) (Right) - SHUGARCAINE  Patient Location: PACU  Anesthesia Type: MAC  Level of Consciousness: awake, alert  and patient cooperative  Airway and Oxygen Therapy: Patient Spontanous Breathing and Patient connected to supplemental oxygen  Post-op Assessment: Post-op Vital signs reviewed, Patient's Cardiovascular Status Stable, Respiratory Function Stable, Patent Airway and No signs of Nausea or vomiting  Post-op Vital Signs: Reviewed and stable  Complications: No apparent anesthesia complications

## 2015-06-22 NOTE — H&P (Signed)
  The History and Physical notes were scanned in.  The patient remains stable and unchanged from the H&P.   Previous H&P reviewed, patient examined, and there are no changes.  Kerianne Gurr 06/22/2015 11:01 AM

## 2015-06-23 ENCOUNTER — Encounter: Payer: Self-pay | Admitting: Ophthalmology

## 2015-07-18 ENCOUNTER — Encounter: Payer: Self-pay | Admitting: Ophthalmology

## 2015-07-19 ENCOUNTER — Encounter: Payer: Self-pay | Admitting: Ophthalmology

## 2016-03-16 ENCOUNTER — Encounter: Payer: Self-pay | Admitting: Emergency Medicine

## 2016-03-16 ENCOUNTER — Ambulatory Visit
Admission: EM | Admit: 2016-03-16 | Discharge: 2016-03-16 | Disposition: A | Discharge status: Home / Self Care | Payer: Medicare Other | Attending: Family Medicine | Admitting: Family Medicine

## 2016-03-16 DIAGNOSIS — J069 Acute upper respiratory infection, unspecified: Secondary | ICD-10-CM | POA: Diagnosis not present

## 2016-03-16 DIAGNOSIS — J302 Other seasonal allergic rhinitis: Secondary | ICD-10-CM | POA: Diagnosis not present

## 2016-03-16 MED ORDER — AZITHROMYCIN 200 MG/5ML PO SUSR: ORAL | Status: DC

## 2016-03-16 MED ORDER — FLUTICASONE PROPIONATE 50 MCG/ACT NA SUSP: 2.0000 | Freq: Every day | NASAL | Status: DC

## 2016-03-16 MED ORDER — HYDROCOD POLST-CPM POLST ER 10-8 MG/5ML PO SUER: 2.5000 mL | Freq: Two times a day (BID) | ORAL | Status: DC

## 2016-03-16 NOTE — Discharge Instructions (Signed)
Allergic Rhinitis Allergic rhinitis is when the mucous membranes in the nose respond to allergens. Allergens are particles in the air that cause your body to have an allergic reaction. This causes you to release allergic antibodies. Through a chain of events, these eventually cause you to release histamine into the blood stream. Although meant to protect the body, it is this release of histamine that causes your discomfort, such as frequent sneezing, congestion, and an itchy, runny nose.  CAUSES Seasonal allergic rhinitis (hay fever) is caused by pollen allergens that may come from grasses, trees, and weeds. Year-round allergic rhinitis (perennial allergic rhinitis) is caused by allergens such as house dust mites, pet dander, and mold spores. SYMPTOMS  Nasal stuffiness (congestion).  Itchy, runny nose with sneezing and tearing of the eyes. DIAGNOSIS Your health care provider can help you determine the allergen or allergens that trigger your symptoms. If you and your health care provider are unable to determine the allergen, skin or blood testing may be used. Your health care provider will diagnose your condition after taking your health history and performing a physical exam. Your health care provider may assess you for other related conditions, such as asthma, pink eye, or an ear infection. TREATMENT Allergic rhinitis does not have a cure, but it can be controlled by:  Medicines that block allergy symptoms. These may include allergy shots, nasal sprays, and oral antihistamines.  Avoiding the allergen. Hay fever may often be treated with antihistamines in pill or nasal spray forms. Antihistamines block the effects of histamine. There are over-the-counter medicines that may help with nasal congestion and swelling around the eyes. Check with your health care provider before taking or giving this medicine. If avoiding the allergen or the medicine prescribed do not work, there are many new medicines  your health care provider can prescribe. Stronger medicine may be used if initial measures are ineffective. Desensitizing injections can be used if medicine and avoidance does not work. Desensitization is when a patient is given ongoing shots until the body becomes less sensitive to the allergen. Make sure you follow up with your health care provider if problems continue. HOME CARE INSTRUCTIONS It is not possible to completely avoid allergens, but you can reduce your symptoms by taking steps to limit your exposure to them. It helps to know exactly what you are allergic to so that you can avoid your specific triggers. SEEK MEDICAL CARE IF:  You have a fever.  You develop a cough that does not stop easily (persistent).  You have shortness of breath.  You start wheezing.  Symptoms interfere with normal daily activities.   This information is not intended to replace advice given to you by your health care provider. Make sure you discuss any questions you have with your health care provider.   Document Released: 08/07/2001 Document Revised: 12/03/2014 Document Reviewed: 07/20/2013 Elsevier Interactive Patient Education 2016 Elsevier Inc.  Cough, Adult Coughing is a reflex that clears your throat and your airways. Coughing helps to heal and protect your lungs. It is normal to cough occasionally, but a cough that happens with other symptoms or lasts a long time may be a sign of a condition that needs treatment. A cough may last only 2-3 weeks (acute), or it may last longer than 8 weeks (chronic). CAUSES Coughing is commonly caused by:  Breathing in substances that irritate your lungs.  A viral or bacterial respiratory infection.  Allergies.  Asthma.  Postnasal drip.  Smoking.  Acid backing up from  the stomach into the esophagus (gastroesophageal reflux).  Certain medicines.  Chronic lung problems, including COPD (or rarely, lung cancer).  Other medical conditions such as heart  failure. HOME CARE INSTRUCTIONS  Pay attention to any changes in your symptoms. Take these actions to help with your discomfort:  Take medicines only as told by your health care provider.  If you were prescribed an antibiotic medicine, take it as told by your health care provider. Do not stop taking the antibiotic even if you start to feel better.  Talk with your health care provider before you take a cough suppressant medicine.  Drink enough fluid to keep your urine clear or pale yellow.  If the air is dry, use a cold steam vaporizer or humidifier in your bedroom or your home to help loosen secretions.  Avoid anything that causes you to cough at work or at home.  If your cough is worse at night, try sleeping in a semi-upright position.  Avoid cigarette smoke. If you smoke, quit smoking. If you need help quitting, ask your health care provider.  Avoid caffeine.  Avoid alcohol.  Rest as needed. SEEK MEDICAL CARE IF:   You have new symptoms.  You cough up pus.  Your cough does not get better after 2-3 weeks, or your cough gets worse.  You cannot control your cough with suppressant medicines and you are losing sleep.  You develop pain that is getting worse or pain that is not controlled with pain medicines.  You have a fever.  You have unexplained weight loss.  You have night sweats. SEEK IMMEDIATE MEDICAL CARE IF:  You cough up blood.  You have difficulty breathing.  Your heartbeat is very fast.   This information is not intended to replace advice given to you by your health care provider. Make sure you discuss any questions you have with your health care provider.   Document Released: 05/11/2011 Document Revised: 08/03/2015 Document Reviewed: 01/19/2015 Elsevier Interactive Patient Education 2016 Elsevier Inc.  Upper Respiratory Infection, Adult Most upper respiratory infections (URIs) are caused by a virus. A URI affects the nose, throat, and upper air  passages. The most common type of URI is often called "the common cold." HOME CARE   Take medicines only as told by your doctor.  Gargle warm saltwater or take cough drops to comfort your throat as told by your doctor.  Use a warm mist humidifier or inhale steam from a shower to increase air moisture. This may make it easier to breathe.  Drink enough fluid to keep your pee (urine) clear or pale yellow.  Eat soups and other clear broths.  Have a healthy diet.  Rest as needed.  Go back to work when your fever is gone or your doctor says it is okay.  You may need to stay home longer to avoid giving your URI to others.  You can also wear a face mask and wash your hands often to prevent spread of the virus.  Use your inhaler more if you have asthma.  Do not use any tobacco products, including cigarettes, chewing tobacco, or electronic cigarettes. If you need help quitting, ask your doctor. GET HELP IF:  You are getting worse, not better.  Your symptoms are not helped by medicine.  You have chills.  You are getting more short of breath.  You have brown or red mucus.  You have yellow or brown discharge from your nose.  You have pain in your face, especially when you  bend forward.  You have a fever.  You have puffy (swollen) neck glands.  You have pain while swallowing.  You have white areas in the back of your throat. GET HELP RIGHT AWAY IF:   You have very bad or constant:  Headache.  Ear pain.  Pain in your forehead, behind your eyes, and over your cheekbones (sinus pain).  Chest pain.  You have long-lasting (chronic) lung disease and any of the following:  Wheezing.  Long-lasting cough.  Coughing up blood.  A change in your usual mucus.  You have a stiff neck.  You have changes in your:  Vision.  Hearing.  Thinking.  Mood. MAKE SURE YOU:   Understand these instructions.  Will watch your condition.  Will get help right away if you are  not doing well or get worse.   This information is not intended to replace advice given to you by your health care provider. Make sure you discuss any questions you have with your health care provider.   Document Released: 04/30/2008 Document Revised: 03/29/2015 Document Reviewed: 02/17/2014 Elsevier Interactive Patient Education [employer]

## 2016-03-16 NOTE — ED Notes (Signed)
Pt reports cough since yesterday and runny nose. Had mild fever 99.2.

## 2016-03-16 NOTE — ED Provider Notes (Signed)
CSN: [id number]     Arrival date & time 03/16/16  1420 History   First MD Initiated Contact with Patient 03/16/16 1456     Chief Complaint  Patient presents with  . Cough   (Consider location/radiation/quality/duration/timing/severity/associated sxs/prior Treatment) HPI  82 year old female who presents with a 2 day history of cough runny nose and fever of sudden onset. Fever at home was 99.2 and today is 99.6. Pulse 84 respirations 18 blood pressure 180/90 and O2 sats of 98%. She has a remote history of smoking.    Past Medical History  Diagnosis Date  . Complication of anesthesia   . PONV (postoperative nausea and vomiting)     once  . Reflux   . Anxiety   . Arthritis     patient states all over, neck arms,hands,etc.  . Seasonal allergies    Past Surgical History  Procedure Laterality Date  . Colonoscopy    . Upper gi endoscopy    . Tubal ligation    . Cataract extraction w/phaco Right 06/22/2015    Procedure: CATARACT EXTRACTION PHACO AND INTRAOCULAR LENS PLACEMENT (IOC);  Surgeon: Lockie Mola, MD;  Location: [facility];  Service: Ophthalmology;  Laterality: Right;  SHUGARCAINE  . Cataract extraction w/phaco Left 03/30/2015    Procedure: CATARACT EXTRACTION PHACO AND INTRAOCULAR LENS PLACEMENT (IOC);  Surgeon: Lockie Mola, MD;  Location: [facility];  Service: Ophthalmology;  Laterality: Left;   History reviewed. No pertinent family history. Social History  Substance Use Topics  . Smoking status: Former Smoker -- 1.00 packs/day    Types: Cigarettes  . Smokeless tobacco: None  . Alcohol Use: No   OB History    No data available     Review of Systems  Constitutional: Positive for fever, chills, activity change and fatigue.  HENT: Positive for congestion, postnasal drip, rhinorrhea and sinus pressure.   Respiratory: Positive for cough and shortness of breath.   All other systems reviewed and are negative.   Allergies  Nsaids;  Adhesive; Bacitracin; and Neosporin  Home Medications   Prior to Admission medications   Medication Sig Start Date End Date Taking? Authorizing Provider  albuterol (PROVENTIL HFA;VENTOLIN HFA) 108 (90 BASE) MCG/ACT inhaler Inhale 1-2 puffs into the lungs every 4 (four) hours as needed for wheezing or shortness of breath. 05/30/15   Eustace Moore, MD  azithromycin [facility]) 200 MG/5ML suspension Take 12.49ml (500 mg) on first day, Then 6.5 ml (250 mg) daily thereafter for 4 days. Total of 5 days therapy. 03/16/16   Lutricia Feil, PA-C  chlorpheniramine-HYDROcodone (TUSSIONEX PENNKINETIC ER) 10-8 MG/5ML SUER Take 2.5 mLs by mouth 2 (two) times daily. 03/16/16   Lutricia Feil, PA-C  fluticasone (FLONASE) 50 MCG/ACT nasal spray Place 2 sprays into both nostrils daily. 03/16/16   Lutricia Feil, PA-C  omeprazole (PRILOSEC) 40 MG capsule Take 40 mg by mouth daily. am    Historical Provider, MD   Meds Ordered and Administered this Visit  Medications - No data to display  BP 180/90 mmHg  Pulse 84  Temp(Src) 99.6 F (37.6 C) (Oral)  Resp 18  Ht  (1.575 m)  Wt 152 lb (68.947 kg)  BMI 27.79 kg/m2  SpO2 98% No data found.   Physical Exam  Constitutional: She is oriented to person, place, and time. She appears well-developed and well-nourished. No distress.  HENT:  Head: Normocephalic and atraumatic.  Right Ear: External ear normal.  Left Ear: External ear normal.  Nose: Nose normal.  Mouth/Throat: Oropharynx is clear and moist. No oropharyngeal exudate.  Eyes: Conjunctivae are normal. Pupils are equal, round, and reactive to light.  Neck: Normal range of motion. Neck supple.  Pulmonary/Chest: Effort normal.  Examination of the lungs shows good air entry. Patient does have some tussive rhonchi. No Crackles are appreciated.  Musculoskeletal: Normal range of motion. She exhibits no edema or tenderness.  Lymphadenopathy:    She has no cervical adenopathy.  Neurological: She is  alert and oriented to person, place, and time.  Skin: Skin is warm and dry. She is not diaphoretic.  Psychiatric: She has a normal mood and affect. Her behavior is normal. Judgment and thought content normal.    ED Course  Procedures (including critical care time)  Labs Review Labs Reviewed - No data to display  Imaging Review No results found.   Visual Acuity Review  Right Eye Distance:   Left Eye Distance:   Bilateral Distance:    Right Eye Near:   Left Eye Near:    Bilateral Near:         MDM   1. Acute URI   2. Seasonal allergies    Discharge Medication List as of 03/16/2016  3:24 PM    START taking these medications   Details  chlorpheniramine-HYDROcodone (TUSSIONEX PENNKINETIC ER) 10-8 MG/5ML SUER Take 2.5 mLs by mouth 2 (two) times daily., Starting 03/16/2016, Until Discontinued, Print    fluticasone (FLONASE) 50 MCG/ACT nasal spray Place 2 sprays into both nostrils daily., Starting 03/16/2016, Until Discontinued, Normal      Plan: 1. Test/x-ray results and diagnosis reviewed with patient 2. rx as per orders; risks, benefits, potential side effects reviewed with patient 3. Recommend supportive treatment with Rest and fluids. Cause of her advanced age and her low-grade fever today have decided to place her on a Z-Pak empirically. If she's not improving or is actually worsening she needs to go to the emergency department. He should follow-up with her primary care physician also. 4. F/u prn if symptoms worsen or don't improve     Lutricia FeilWilliam P Roemer, PA-C 03/16/16 2147

## 2019-01-28 ENCOUNTER — Ambulatory Visit
Admission: EM | Admit: 2019-01-28 | Discharge: 2019-01-28 | Disposition: A | Discharge status: Home / Self Care | Payer: Medicare Other | Attending: Family Medicine | Admitting: Family Medicine

## 2019-01-28 ENCOUNTER — Other Ambulatory Visit: Payer: Self-pay

## 2019-01-28 DIAGNOSIS — S91109A Unspecified open wound of unspecified toe(s) without damage to nail, initial encounter: Secondary | ICD-10-CM

## 2019-01-28 DIAGNOSIS — Y92099 Unspecified place in other non-institutional residence as the place of occurrence of the external cause: Secondary | ICD-10-CM | POA: Diagnosis not present

## 2019-01-28 MED ORDER — MUPIROCIN 2 % EX OINT: 1.0000 "application " | TOPICAL_OINTMENT | Freq: Two times a day (BID) | CUTANEOUS | 0 refills | Status: AC

## 2019-01-28 NOTE — ED Notes (Signed)
2x2 gauze to wound and wrapped in kerlix

## 2019-01-28 NOTE — ED Triage Notes (Signed)
Pt stepped on something in her carpet yesterday morning at 0630. Laceration to bottom of right great toe

## 2019-01-28 NOTE — ED Provider Notes (Signed)
[facility]    CSN: [id number] Arrival date & time: 01/28/19  1104   History   Chief Complaint Chief Complaint  Patient presents with  . Extremity Laceration   HPI  85 year old female presents with a cut to her right great toe.  She reports that this actually occurred on Monday morning.  States she was walking barefoot in her house and somehow cut the bottom of her right great toe.  Patient is concerned as she took her dressing off today and the area was bleeding.  No reports of redness.  No drainage.  No reports of fever.  No other associated symptoms.  No other complaints.  PMH, Surgical Hx, Family Hx, Social History reviewed and updated as below.  Past Medical History:  Diagnosis Date  . Anxiety   . Arthritis    patient states all over, neck arms,hands,etc.  . Complication of anesthesia   . PONV (postoperative nausea and vomiting)    once  . Reflux   . Seasonal allergies    Past Surgical History:  Procedure Laterality Date  . CATARACT EXTRACTION W/PHACO Right 06/22/2015   Procedure: CATARACT EXTRACTION PHACO AND INTRAOCULAR LENS PLACEMENT (IOC);  Surgeon: Lockie Mola, MD;  Location: [facility];  Service: Ophthalmology;  Laterality: Right;  SHUGARCAINE  . CATARACT EXTRACTION W/PHACO Left 03/30/2015   Procedure: CATARACT EXTRACTION PHACO AND INTRAOCULAR LENS PLACEMENT (IOC);  Surgeon: Lockie Mola, MD;  Location: [facility];  Service: Ophthalmology;  Laterality: Left;  . COLONOSCOPY    . TUBAL LIGATION    . UPPER GI ENDOSCOPY      OB History   No obstetric history on file.      Home Medications    Prior to Admission medications   Medication Sig Start Date End Date Taking? Authorizing Provider  amLODipine (NORVASC) 5 MG tablet Take by mouth. 12/08/18 12/08/19 Yes [provider]  apixaban (ELIQUIS) 5 MG TABS tablet TAKE (1) TABLET BY MOUTH TWICE DAILY 07/15/18  Yes [provider]  carvedilol (COREG) 3.125  MG tablet TAKE (1/2) TABLET BY MOUTH TWICE DAILY 12/01/18  Yes [provider]  losartan (COZAAR) 100 MG tablet TAKE (1) TABLET BY MOUTH EVERY DAY FOR BLOOD PRESSURE 12/01/18  Yes [provider]  mupirocin ointment (BACTROBAN) 2 % Apply 1 application topically 2 (two) times daily for 7 days. 01/28/19 02/04/19  Tommie Sams, DO  omeprazole (PRILOSEC) 40 MG capsule Take 40 mg by mouth daily. am    [provider]   Social History Social History   Tobacco Use  . Smoking status: Former Smoker    Packs/day: 1.00    Types: Cigarettes  . Smokeless tobacco: Never Used  Substance Use Topics  . Alcohol use: No  . Drug use: No    Allergies   Azithromycin; Nsaids; Adhesive [tape]; Bacitracin; and Neosporin [neomycin-bacitracin zn-polymyx]   Review of Systems Review of Systems  Constitutional: Negative.   Skin: Positive for wound.   Physical Exam Triage Vital Signs ED Triage Vitals  Enc Vitals Group     BP 01/28/19 1129 (!) 160/71     Pulse Rate 01/28/19 1129 67     Resp 01/28/19 1129 17     Temp 01/28/19 1129 97.8 F (36.6 C)     Temp Source 01/28/19 1129 Oral     SpO2 01/28/19 1129 99 %     Weight 01/28/19 1130 155 lb (70.3 kg)     Height 01/28/19 1130 5\' 2"  (1.575 m)  Head Circumference --      Peak Flow --      Pain Score 01/28/19 1129 0     Pain Loc --      Pain Edu? --      Excl. in GC? --    Updated Vital Signs BP (!) 160/71 (BP Location: Left Arm)   Pulse 67   Temp 97.8 F (36.6 C) (Oral)   Resp 17   Ht 5\' 2"  (1.575 m)   Wt 70.3 kg   SpO2 99%   BMI 28.35 kg/m   Visual Acuity Right Eye Distance:   Left Eye Distance:   Bilateral Distance:    Right Eye Near:   Left Eye Near:    Bilateral Near:     Physical Exam Vitals signs and nursing note reviewed.  Constitutional:      General: She is not in acute distress.    Appearance: Normal appearance.  HENT:     Head: Normocephalic and atraumatic.  Eyes:     General: No scleral  icterus.    Conjunctiva/sclera: Conjunctivae normal.  Cardiovascular:     Rate and Rhythm: Normal rate and regular rhythm.  Pulmonary:     Effort: Pulmonary effort is normal.     Breath sounds: Normal breath sounds.  Skin:    Comments: Right great toe -surface with a small open wound.  Appears superficial.  No surrounding erythema.  No drainage.  Neurological:     Mental Status: She is alert.  Psychiatric:        Mood and Affect: Mood normal.        Behavior: Behavior normal.    UC Treatments / Results  Labs (all labs ordered are listed, but only abnormal results are displayed) Labs Reviewed - No data to display  EKG None  Radiology No results found.  Procedures Procedures (including critical care time)  Medications Ordered in UC Medications - No data to display  Initial Impression / Assessment and Plan / UC Course  I have reviewed the triage vital signs and the nursing notes.  Pertinent labs & imaging results that were available during my care of the patient were reviewed by me and considered in my medical decision making (see chart for details).    85 year old female presents with a small open wound.  No evidence of infection.  This is not amenable to closure (occurred Monday).  Topical Bactroban.  Daily dressing changes.  Final Clinical Impressions(s) / UC Diagnoses   Final diagnoses:  Open wound of toe, initial encounter     Discharge Instructions     Antibiotic ointment twice daily as prescribed.  Keep the wound covered.   Appears to be healing.  Take care  Dr. Adriana Simas    ED Prescriptions    Medication Sig Dispense Auth. Provider   mupirocin ointment (BACTROBAN) 2 % Apply 1 application topically 2 (two) times daily for 7 days. 22 g Tommie Sams, DO     Controlled Substance Prescriptions [place] Controlled Substance Registry consulted? Not Applicable   Tommie Sams, DO 01/28/19 1213

## 2019-01-28 NOTE — Discharge Instructions (Signed)
Antibiotic ointment twice daily as prescribed.  Keep the wound covered.   Appears to be healing.  Take care  Dr. Adriana Simas

## 2021-09-15 ENCOUNTER — Ambulatory Visit
Admission: EM | Admit: 2021-09-15 | Discharge: 2021-09-15 | Disposition: A | Discharge status: Home / Self Care | Payer: Medicare Other | Attending: Medical Oncology | Admitting: Medical Oncology

## 2021-09-15 ENCOUNTER — Other Ambulatory Visit: Payer: Self-pay

## 2021-09-15 ENCOUNTER — Ambulatory Visit (INDEPENDENT_AMBULATORY_CARE_PROVIDER_SITE_OTHER): Payer: Medicare Other

## 2021-09-15 DIAGNOSIS — S92515A Nondisplaced fracture of proximal phalanx of left lesser toe(s), initial encounter for closed fracture: Secondary | ICD-10-CM

## 2021-09-15 DIAGNOSIS — M79675 Pain in left toe(s): Secondary | ICD-10-CM | POA: Diagnosis not present

## 2021-09-15 DIAGNOSIS — S92345A Nondisplaced fracture of fourth metatarsal bone, left foot, initial encounter for closed fracture: Secondary | ICD-10-CM

## 2021-09-15 NOTE — ED Triage Notes (Signed)
Pt c/o pain to her 4th toe on her left foot. Pt reports hitting it on her rolator walker this morning. Pt does have some swelling and bruising around the toe.

## 2021-09-15 NOTE — ED Provider Notes (Signed)
[facility]    CSN: [id number] Arrival date & time: 09/15/21  1420      History   Chief Complaint Chief Complaint  Patient presents with   Toe Pain    Left 4th    HPI Betty Munoz is a 87 y.o. female.   HPI  Toe Pain: Pt presents with left foot and toe pain following hitting the foot hard on her Rolator this morning. There is bruising and swelling in the area of pain and tenderness. She has tried nothing yet for symptoms as she reports that she does not like taking medications for pain and "can bear it". No skin breakdown, numbness or tingling.   Past Medical History:  Diagnosis Date   Anxiety    Arthritis    patient states all over, neck arms,hands,etc.   Complication of anesthesia    PONV (postoperative nausea and vomiting)    once   Reflux    Seasonal allergies     There are no problems to display for this patient.   Past Surgical History:  Procedure Laterality Date   CATARACT EXTRACTION W/PHACO Right 06/22/2015   Procedure: CATARACT EXTRACTION PHACO AND INTRAOCULAR LENS PLACEMENT (IOC);  Surgeon: Lockie Mola, MD;  Location: [facility];  Service: Ophthalmology;  Laterality: Right;  SHUGARCAINE   CATARACT EXTRACTION W/PHACO Left 03/30/2015   Procedure: CATARACT EXTRACTION PHACO AND INTRAOCULAR LENS PLACEMENT (IOC);  Surgeon: Lockie Mola, MD;  Location: [facility];  Service: Ophthalmology;  Laterality: Left;   COLONOSCOPY     TUBAL LIGATION     UPPER GI ENDOSCOPY      OB History   No obstetric history on file.      Home Medications    Prior to Admission medications   Medication Sig Start Date End Date Taking? Authorizing Provider  amLODipine (NORVASC) 5 MG tablet Take by mouth. 12/08/18 09/15/21 Yes [provider]  apixaban (ELIQUIS) 5 MG TABS tablet TAKE (1) TABLET BY MOUTH TWICE DAILY 07/15/18  Yes [provider]  carvedilol (COREG) 3.125 MG tablet TAKE (1/2) TABLET BY MOUTH TWICE  DAILY 12/01/18  Yes [provider]  losartan (COZAAR) 100 MG tablet TAKE (1) TABLET BY MOUTH EVERY DAY FOR BLOOD PRESSURE 12/01/18  Yes [provider]  omeprazole (PRILOSEC) 40 MG capsule Take 40 mg by mouth daily. am    [provider]    Family History History reviewed. No pertinent family history.  Social History Social History   Tobacco Use   Smoking status: Former    Packs/day: 1.00    Types: Cigarettes   Smokeless tobacco: Never  Substance Use Topics   Alcohol use: No   Drug use: No     Allergies   Azithromycin, Nsaids, Adhesive [tape], Bacitracin, and Neosporin [neomycin-bacitracin zn-polymyx]   Review of Systems Review of Systems  As stated above in HPI Physical Exam Triage Vital Signs ED Triage Vitals  Enc Vitals Group     BP 09/15/21 1509 (!) 149/68     Pulse Rate 09/15/21 1509 69     Resp 09/15/21 1509 18     Temp 09/15/21 1509 98.9 F (37.2 C)     Temp Source 09/15/21 1509 Oral     SpO2 09/15/21 1509 100 %     Weight 09/15/21 1506 155 lb (70.3 kg)     Height 09/15/21 1506 5\' 2"  (1.575 m)     Head Circumference --      Peak Flow --  Pain Score 09/15/21 1506 9     Pain Loc --      Pain Edu? --      Excl. in GC? --    No data found.  Updated Vital Signs BP (!) 149/68 (BP Location: Left Arm)   Pulse 69   Temp 98.9 F (37.2 C) (Oral)   Resp 18   Ht 5\' 2"  (1.575 m)   Wt 155 lb (70.3 kg)   SpO2 100%   BMI 28.35 kg/m   Physical Exam Vitals and nursing note reviewed.  Constitutional:      General: She is not in acute distress.    Appearance: Normal appearance. She is not ill-appearing, toxic-appearing or diaphoretic.  Musculoskeletal:        General: Swelling and tenderness present. Normal range of motion.       Feet:     Comments: See diagram  Skin:    General: Skin is warm.     Capillary Refill: Capillary refill takes less than 2 seconds.     Findings: Bruising present.  Neurological:     General: No  focal deficit present.     Mental Status: She is alert.     UC Treatments / Results  Labs (all labs ordered are listed, but only abnormal results are displayed) Labs Reviewed - No data to display  EKG   Radiology No results found.  Procedures Procedures (including critical care time)  Medications Ordered in UC Medications - No data to display  Initial Impression / Assessment and Plan / UC Course  I have reviewed the triage vital signs and the nursing notes.  Pertinent labs & imaging results that were available during my care of the patient were reviewed by me and considered in my medical decision making (see chart for details).     New. Viewed x ray showing fracture. Discussed with patient. She elects to have a post op shoe. She declines any pain medication- encouraged RICE and orthopedic follow up.  Final Clinical Impressions(s) / UC Diagnoses   Final diagnoses:  None   Discharge Instructions   None    ED Prescriptions   None    PDMP not reviewed this encounter.   , Rushie Chestnut 09/15/21 1616

## 2022-07-28 IMAGING — CR DG FOOT COMPLETE 3+V*L*
5 series · 5 of 5 positions shown · non-contrast
Comparison: None.

CLINICAL DATA: Foot injury this morning.  Pain in the 4th toe.

EXAM:
LEFT FOOT - COMPLETE 3+ VIEW

[foot ap]
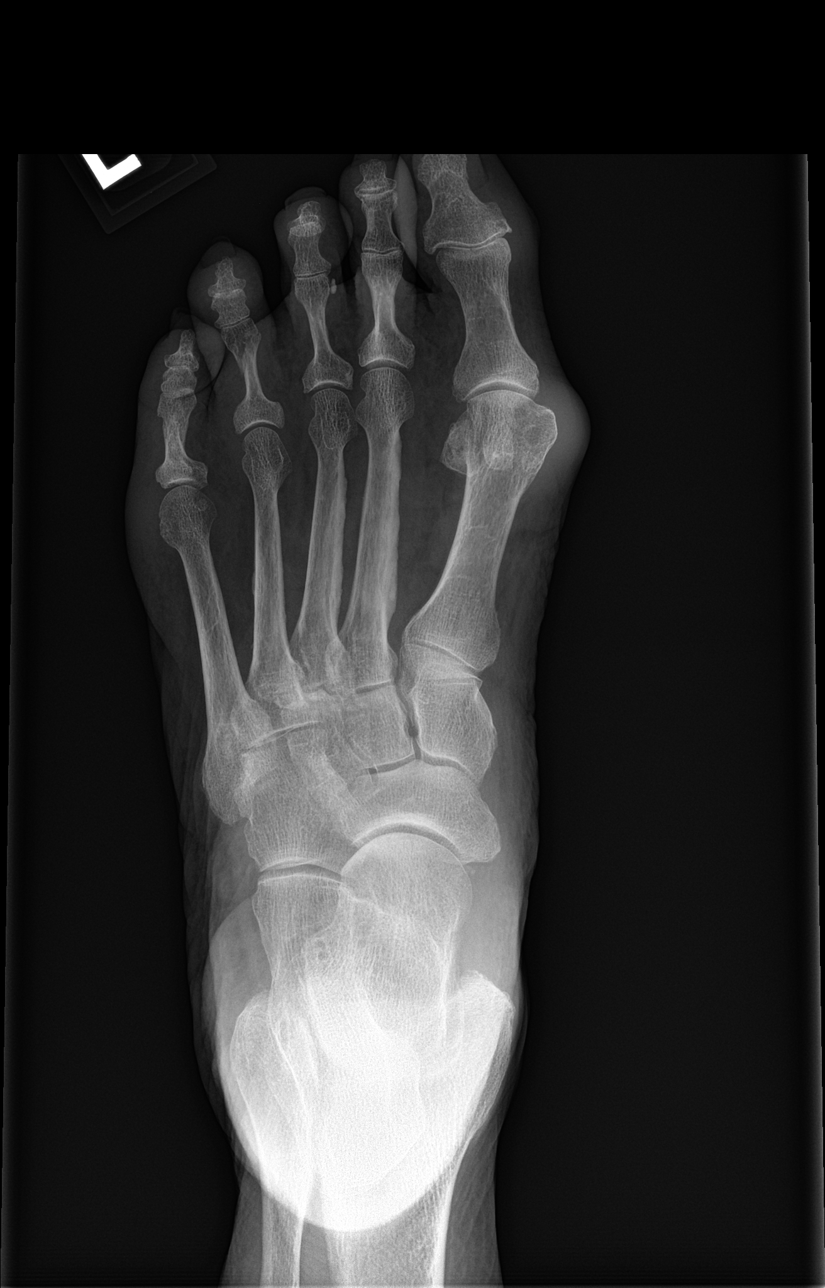

[foot obl]
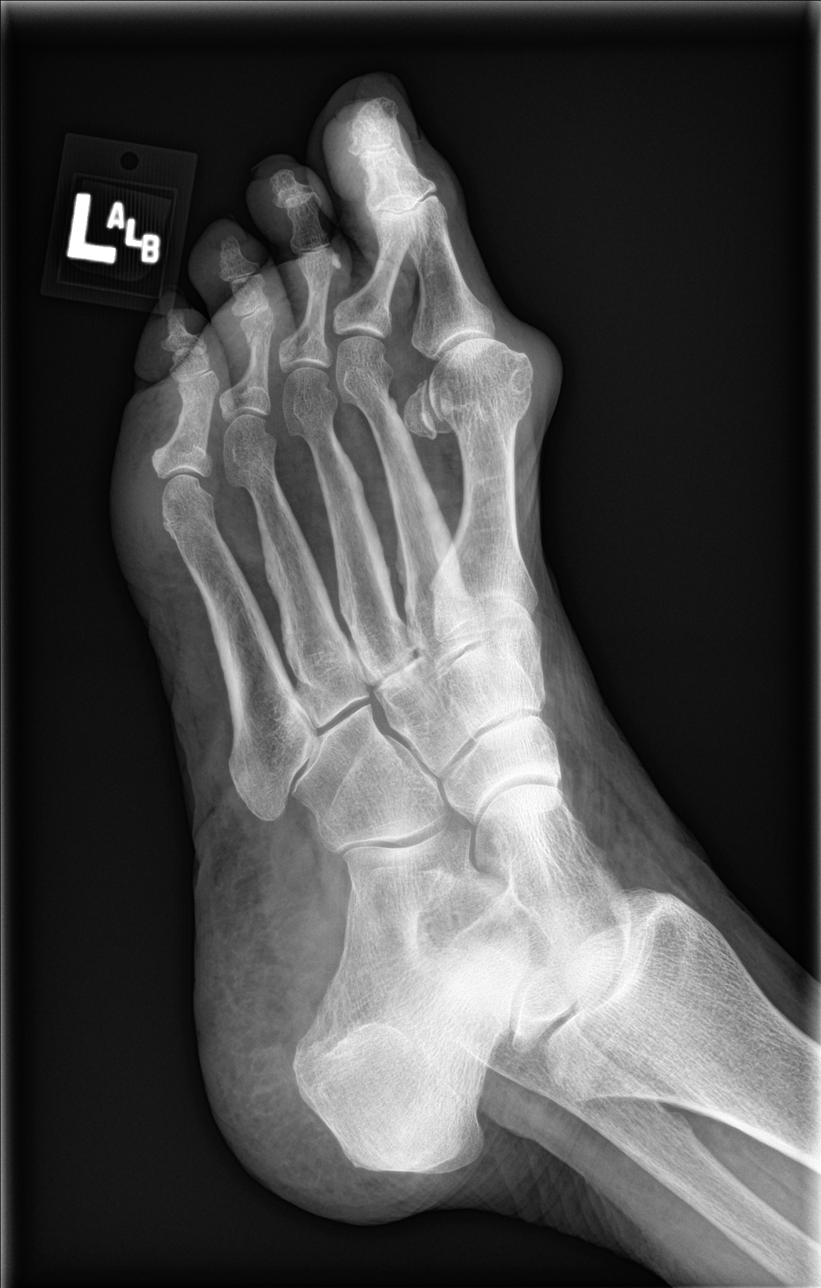

[foot lat (1 of 3)]
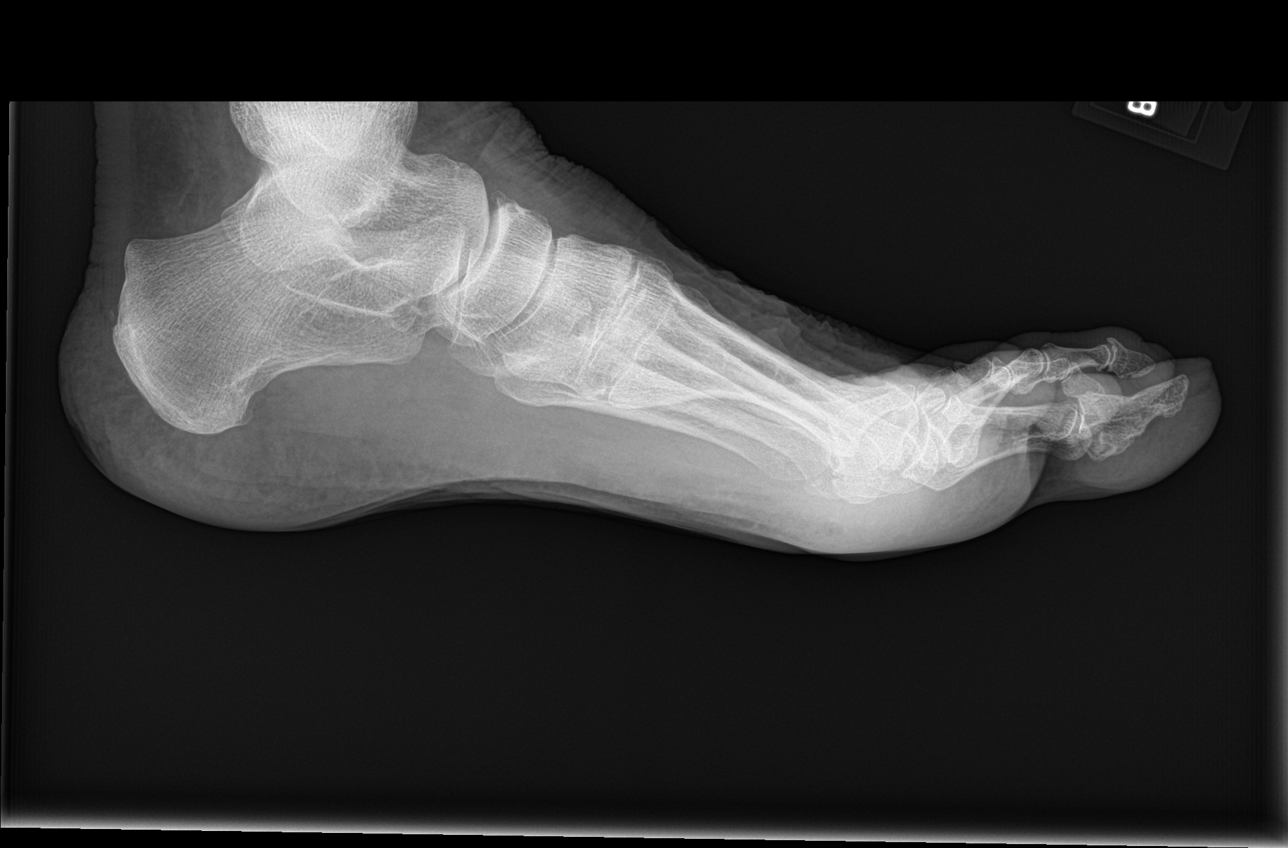

[foot lat (2 of 3)]
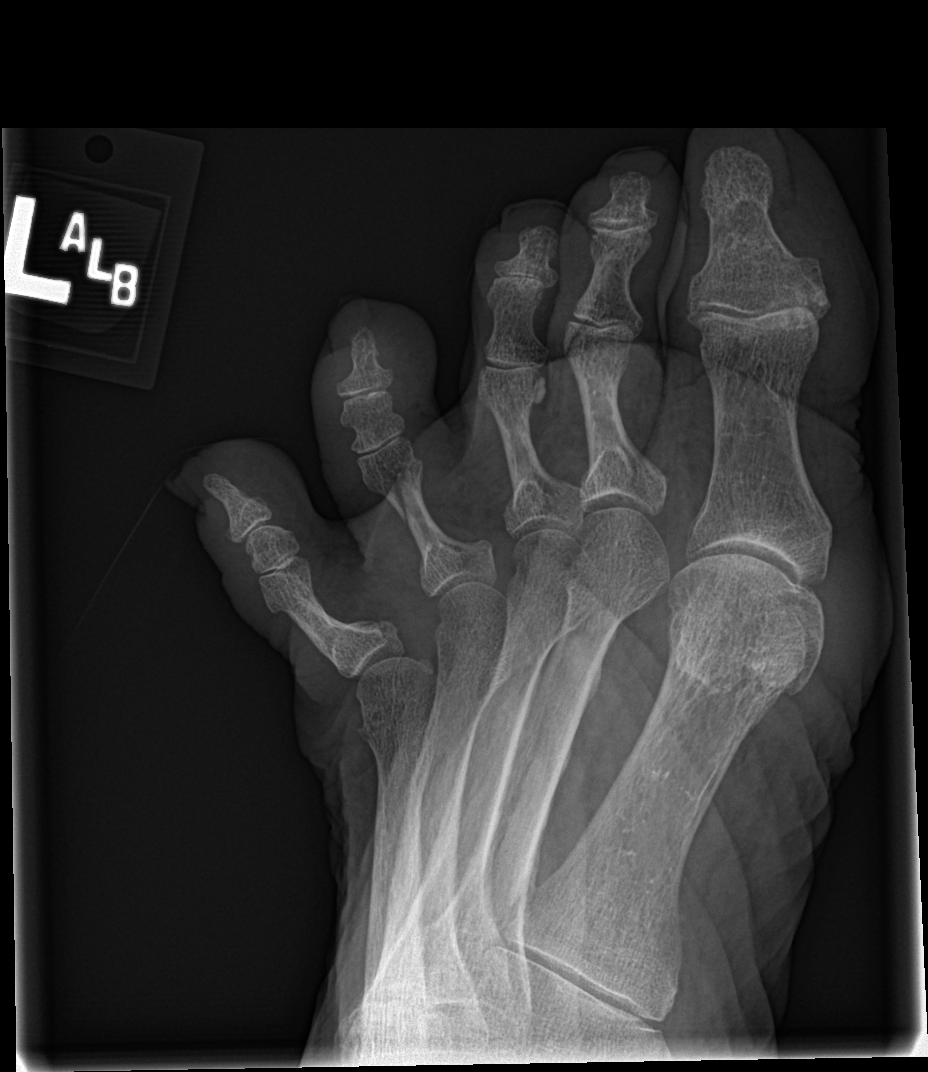

[foot lat (3 of 3)]
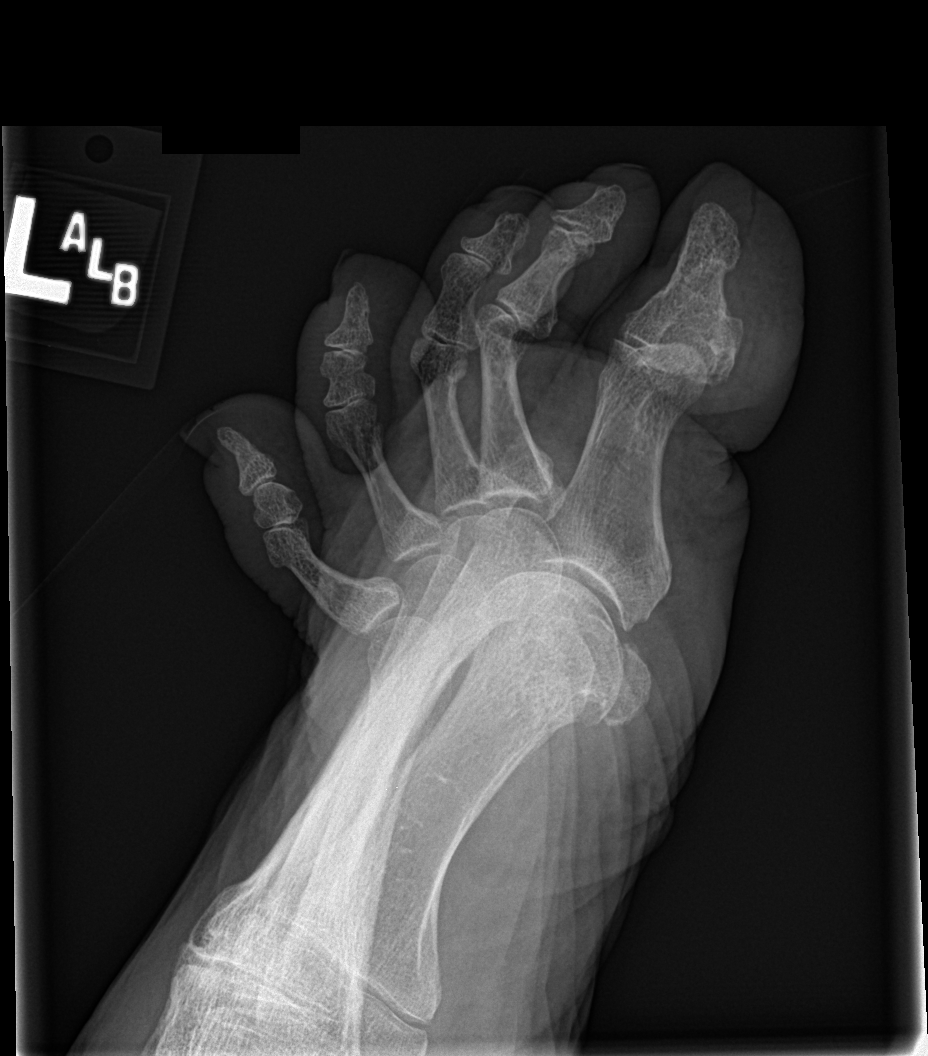

[5 of 5 positions shown; findings below may reference images not displayed]

FINDINGS: The bones appear mildly demineralized. There is an acute,
nondisplaced fracture involving the neck of the 4th proximal
phalanx, best seen on the PA view. No definite intra-articular
extension or other acute osseous findings. There are degenerative
changes at the 1st metatarsophalangeal joint associated with a mild
hallux valgus deformity and bunion. Globular calcification is noted
medial to the 3rd proximal interphalangeal joint, possibly acute
calcific periarthritis.
IMPRESSION: Acute nondisplaced fracture of the 4th proximal phalanx. Possible
calcific periarthritis at the 3rd PIP joint. First MTP degenerative
changes and bunion formation.
